# Patient Record
Sex: Male | Born: 1985 | Race: White | Hispanic: No | Marital: Married | Smoking: Current every day smoker
Health system: Southern US, Community
[De-identification: ages and names within clinical notes are randomized; demographics above are authoritative.]

## PROBLEM LIST (undated history)

## (undated) DIAGNOSIS — E66813 Obesity, class 3: Secondary | ICD-10-CM

## (undated) DIAGNOSIS — K219 Gastro-esophageal reflux disease without esophagitis: Secondary | ICD-10-CM

## (undated) DIAGNOSIS — E119 Type 2 diabetes mellitus without complications: Secondary | ICD-10-CM

## (undated) HISTORY — DX: Gastro-esophageal reflux disease without esophagitis: K21.9

## (undated) HISTORY — DX: Morbid (severe) obesity due to excess calories: E66.01

## (undated) HISTORY — DX: Obesity, class 3: E66.813

---
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
## 2006-05-18 ENCOUNTER — Ambulatory Visit (HOSPITAL_COMMUNITY): Admission: RE | Admit: 2006-05-18 | Discharge: 2006-05-18 | Payer: Self-pay | Admitting: Family Medicine

## 2011-08-31 ENCOUNTER — Other Ambulatory Visit: Payer: Self-pay | Admitting: Gastroenterology

## 2011-08-31 DIAGNOSIS — R197 Diarrhea, unspecified: Secondary | ICD-10-CM

## 2011-09-01 ENCOUNTER — Ambulatory Visit
Admission: RE | Admit: 2011-09-01 | Discharge: 2011-09-01 | Disposition: A | Discharge status: Home / Self Care | Payer: Federal, State, Local not specified - PPO | Source: Ambulatory Visit | Attending: Gastroenterology | Admitting: Gastroenterology

## 2011-09-01 DIAGNOSIS — R197 Diarrhea, unspecified: Secondary | ICD-10-CM

## 2013-12-29 ENCOUNTER — Ambulatory Visit (INDEPENDENT_AMBULATORY_CARE_PROVIDER_SITE_OTHER): Payer: Federal, State, Local not specified - PPO | Admitting: Family Medicine

## 2013-12-29 ENCOUNTER — Encounter: Payer: Self-pay | Admitting: Family Medicine

## 2013-12-29 ENCOUNTER — Ambulatory Visit (INDEPENDENT_AMBULATORY_CARE_PROVIDER_SITE_OTHER): Payer: Federal, State, Local not specified - PPO

## 2013-12-29 ENCOUNTER — Encounter (INDEPENDENT_AMBULATORY_CARE_PROVIDER_SITE_OTHER): Payer: Self-pay

## 2013-12-29 VITALS — BP 125/75 | HR 82 | Temp 98.8°F | Ht 73.0 in | Wt 325.2 lb

## 2013-12-29 DIAGNOSIS — M25569 Pain in unspecified knee: Secondary | ICD-10-CM | POA: Insufficient documentation

## 2013-12-29 DIAGNOSIS — M25562 Pain in left knee: Secondary | ICD-10-CM

## 2013-12-29 DIAGNOSIS — K219 Gastro-esophageal reflux disease without esophagitis: Secondary | ICD-10-CM | POA: Insufficient documentation

## 2013-12-29 DIAGNOSIS — R635 Abnormal weight gain: Secondary | ICD-10-CM

## 2013-12-29 MED ORDER — NAPROXEN 500 MG PO TABS: 500.0000 mg | ORAL_TABLET | Freq: Two times a day (BID) | ORAL | Status: DC

## 2013-12-29 NOTE — Progress Notes (Signed)
Patient ID: George Snow, male   DOB: 09/26/1986, 28 y.o.   MRN: [id number] SUBJECTIVE: CC: Chief Complaint  Patient presents with  . Acute Visit    c/o left knee pain onset yest.     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 [place]  [place] airport. No injury.no fever.  Started hurting yesterday after he came home.   Past Medical History  Diagnosis Date  . Obesity, Class III, BMI 40-49.9 (morbid obesity)   . GERD (gastroesophageal reflux disease)    No past surgical history on file. History   Social History  . Marital Status: Married    Spouse Name: N/A    Number of Children: N/A  . Years of Education: N/A   Occupational History  . Not on file.   Social History Main Topics  . Smoking status: Current Every Day Smoker  . Smokeless tobacco: Not on file  . Alcohol Use: Not on file  . Drug Use: Not on file  . Sexual Activity: Not on file   Other Topics Concern  . Not on file   Social History Narrative  . No narrative on file   Family History  Problem Relation Age of Onset  . Hypertension Mother   . Graves' disease Mother   . Rheum arthritis Father    No current outpatient prescriptions on file prior to visit.   No current facility-administered medications on file prior to visit.   No Known Allergies  There is no immunization history on file for this patient. Prior to Admission medications   Not on File     ROS: As above in the HPI. All other systems are stable or negative.  OBJECTIVE: APPEARANCE:  Patient in no acute distress.The patient appeared well nourished and normally developed. Acyanotic. Waist: VITAL SIGNS:BP 125/75  Pulse 82  Temp(Src) 98.8 F (37.1 C) (Oral)  Ht 6\' 1"  (1.854 m)  Wt 325 lb 3.2 oz (147.51 kg)  BMI 42.91 kg/m2  Morbidly obese WM SKIN: warm and  Dry without overt rashes, tattoos and scars  HEAD and Neck: without JVD, Head and scalp: normal Eyes:No scleral icterus. Fundi normal, eye movements normal. Ears:  Auricle normal, canal normal, Tympanic membranes normal, insufflation normal. Nose: normal Throat: normal Neck & thyroid: normal  CHEST & LUNGS: Chest wall: normal Lungs: Clear  CVS: Reveals the PMI to be normally located. Regular rhythm, First and Second Heart sounds are normal,  absence of murmurs, rubs or gallops. Peripheral vasculature: Radial pulses: normal Dorsal pedis pulses: normal Posterior pulses: normal  ABDOMEN:  Appearance: obese Benign, no organomegaly, no masses, no Abdominal Aortic enlargement. No Guarding , no rebound. No Bruits. Bowel sounds: normal  RECTAL: N/A GU: N/A  EXTREMETIES: nonedematous.  MUSCULOSKELETAL:  Spine: normal Joints: valgus  Deformity left knee soft tissue swelling. Crepitus /pop felt in the lateral margin with flexion. Flexion reduced by 15 degrees rest of ROM intact. Medial and lateral ligaments intact. . lachman intact. Grind  Test negative.  NEUROLOGIC: oriented to time,place and person; nonfocal. Strength is normal Sensory is normal Reflexes are normal Cranial Nerves are normal.  ASSESSMENT: Knee pain, acute, left - Plan: DG Knee 1-2 Views Left, naproxen (NAPROSYN) 500 MG tablet  Obesity, Class III, BMI 40-49.9 (morbid obesity)  GERD (gastroesophageal reflux disease)  PLAN:      Dr Woodroe ModeFrancis Kimla Furth's Recommendations  For nutrition information, I recommend books:  1).Eat to Live by Dr Monico HoarJoel Fuhrman. 2).Prevent and Reverse Heart Disease by Dr  Suzzette Righter. 3) Dr Katherina Right Book:  Program to Reverse Diabetes  Exercise recommendations are:  If unable to walk, then the patient can exercise in a chair 3 times a day. By flapping arms like a bird gently and raising legs outwards to the front.  If ambulatory, the patient can go for walks for 30 minutes 3 times a week. Then increase the intensity and duration as tolerated.  Goal is to try to attain exercise frequency to 5 times a week.  If applicable: Best to  perform resistance exercises (machines or weights) 2 days a week and cardio type exercises 3 days per week.  Encouraged aggressive weight loss.  Ice packs. Knee sleeve.  Orders Placed This Encounter  Procedures  . DG Knee 1-2 Views Left    Standing Status: Future     Number of Occurrences: 1     Standing Expiration Date: 02/28/2015    Order Specific Question:  Reason for Exam (SYMPTOM  OR DIAGNOSIS REQUIRED)    Answer:  pain    Order Specific Question:  Preferred imaging location?    Answer:  Internal  [facility] reading (PRIMARY) by  Dr.Gloriana Piltz: valgus  Deformity. No other acute findings.                                   Meds ordered this encounter  Medications  . naproxen (NAPROSYN) 500 MG tablet    Sig: Take 1 tablet (500 mg total) by mouth 2 (two) times daily with a meal.    Dispense:  30 tablet    Refill:  0   There are no discontinued medications. Return in about 1 week (around 01/05/2014) for Recheck medical problems. If not improving , consider MRI for r/o meniscus tear.  Gwenetta Devos P. Modesto Charon, M.D.

## 2013-12-29 NOTE — Patient Instructions (Addendum)
      Dr Woodroe ModeFrancis Salima Rumer's Recommendations  For nutrition information, I recommend books:  1).Eat to Live by Dr Monico HoarJoel Fuhrman. 2).Prevent and Reverse Heart Disease by Dr Suzzette Righteraldwell Esselstyn. 3) Dr Katherina RightNeal Barnard's Book:  Program to Reverse Diabetes  Exercise recommendations are:  If unable to walk, then the patient can exercise in a chair 3 times a day. By flapping arms like a bird gently and raising legs outwards to the front.  If ambulatory, the patient can go for walks for 30 minutes 3 times a week. Then increase the intensity and duration as tolerated.  Goal is to try to attain exercise frequency to 5 times a week.  If applicable: Best to perform resistance exercises (machines or weights) 2 days a week and cardio type exercises 3 days per week.    Ice packs to knee every 2 hours. Wear knee brace. Aggressive weight loss.

## 2014-12-07 IMAGING — CR DG KNEE 1-2V*L*
2 series · 2 of 2 positions shown · non-contrast
Comparison: 05/18/2006

CLINICAL DATA: Knee pain.

EXAM:
LEFT KNEE - 1-2 VIEW

[view not recorded (1 of 2)]
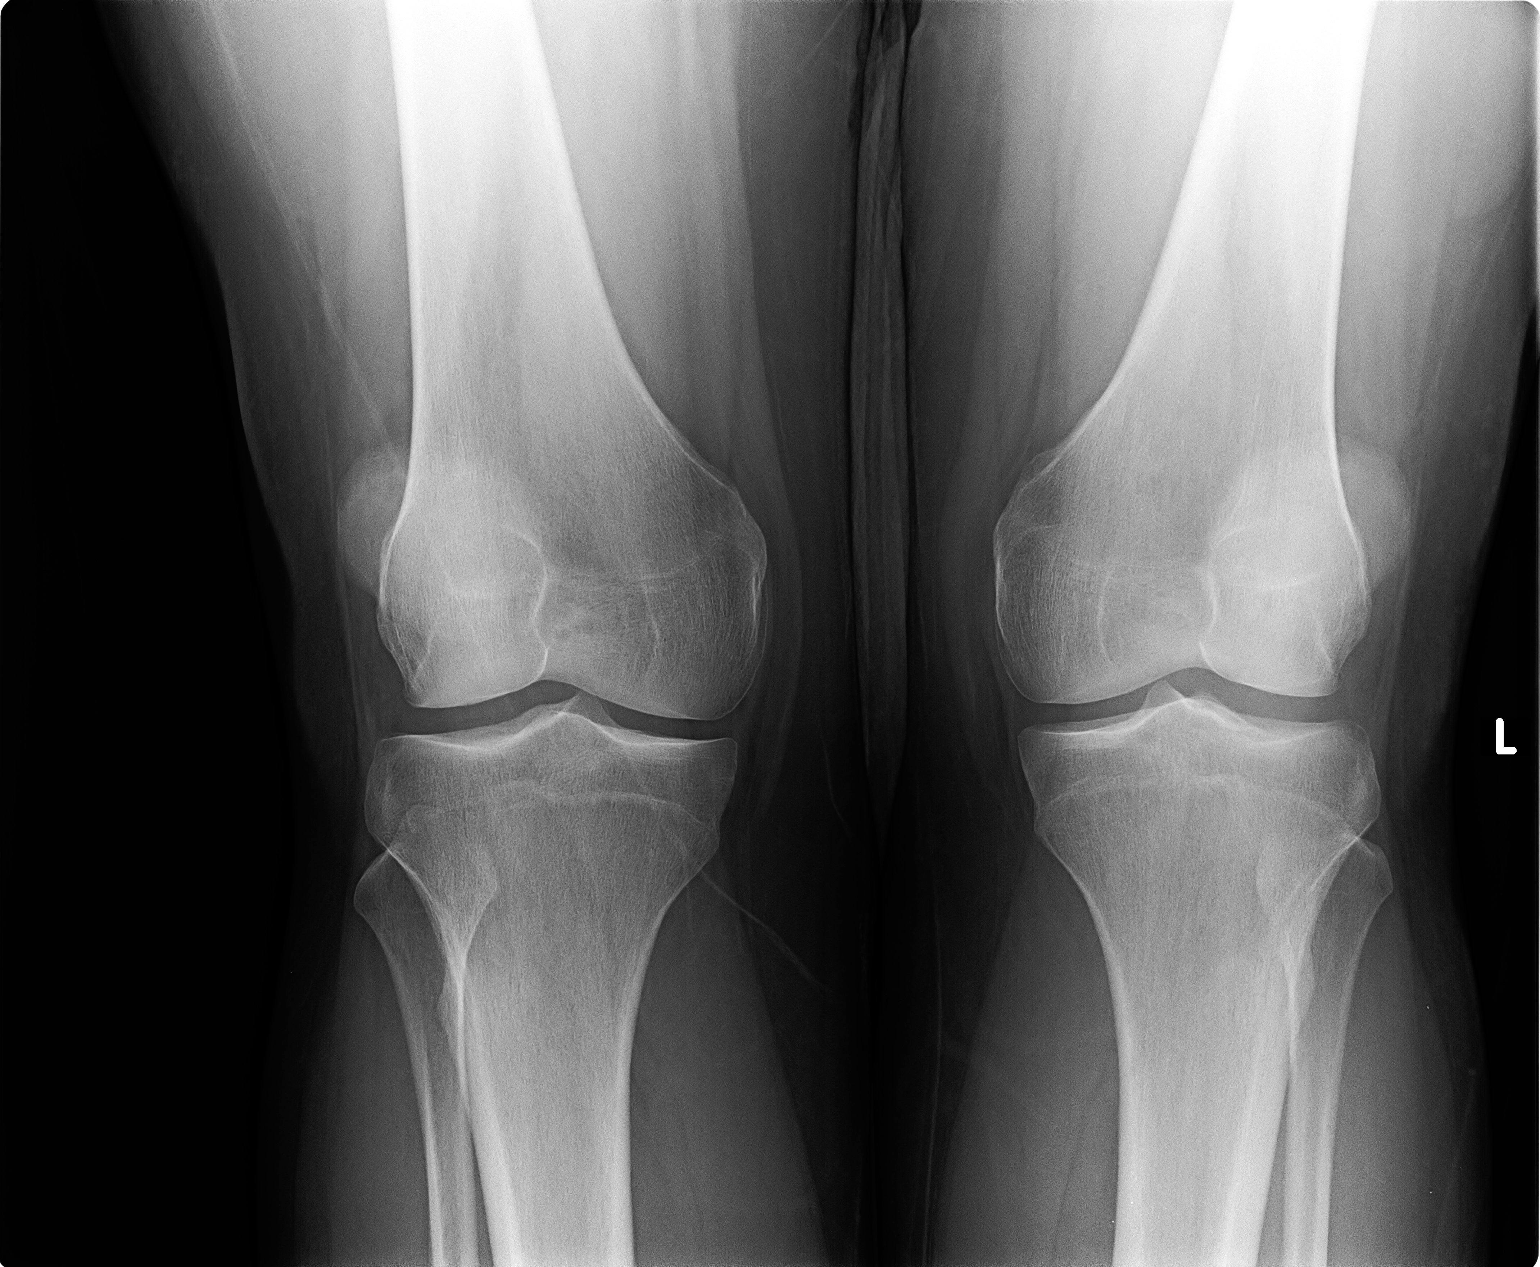

[view not recorded (2 of 2)]
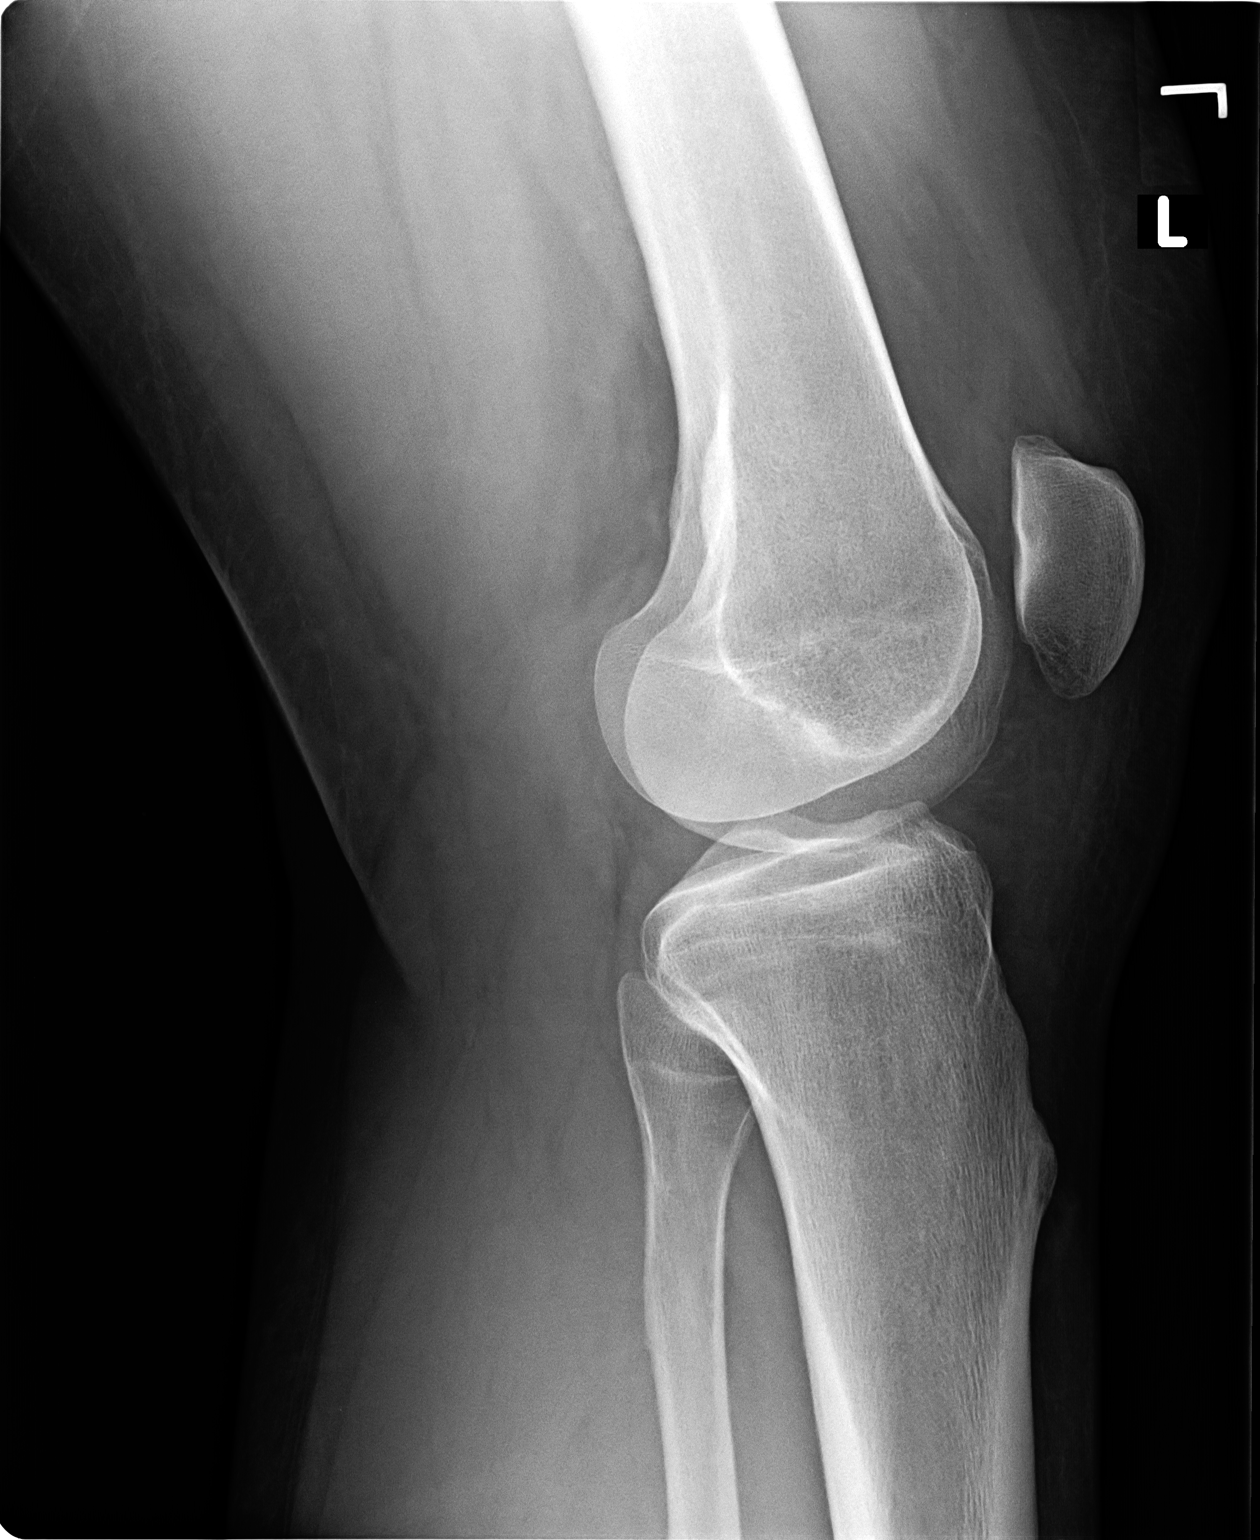

[2 of 2 positions shown; findings below may reference images not displayed]

FINDINGS: AP view of both knees were obtained. There is no significant joint
space narrowing. The left knee is located without acute fracture. No
definite joint effusion.
IMPRESSION: No acute bone abnormality in the left knee.

## 2016-09-26 ENCOUNTER — Ambulatory Visit (INDEPENDENT_AMBULATORY_CARE_PROVIDER_SITE_OTHER): Payer: Federal, State, Local not specified - PPO | Admitting: Psychology

## 2016-10-10 ENCOUNTER — Ambulatory Visit (INDEPENDENT_AMBULATORY_CARE_PROVIDER_SITE_OTHER): Payer: Federal, State, Local not specified - PPO | Admitting: Psychology

## 2016-10-30 ENCOUNTER — Encounter (HOSPITAL_COMMUNITY): Payer: Self-pay | Admitting: Psychology

## 2016-10-30 ENCOUNTER — Ambulatory Visit (INDEPENDENT_AMBULATORY_CARE_PROVIDER_SITE_OTHER): Payer: Federal, State, Local not specified - PPO | Admitting: Psychology

## 2016-10-30 DIAGNOSIS — F322 Major depressive disorder, single episode, severe without psychotic features: Secondary | ICD-10-CM | POA: Diagnosis not present

## 2016-10-30 NOTE — Progress Notes (Signed)
Patient reports that he has improved with regard to depressive symptoms with having significant depressive symptoms about 1 per week since last seen.  He is walking everyday and doing more with family.  Stress at work continues to be an issues; coping better with father's death.

## 2019-09-29 ENCOUNTER — Other Ambulatory Visit: Payer: Self-pay

## 2019-09-29 DIAGNOSIS — Z20822 Contact with and (suspected) exposure to covid-19: Secondary | ICD-10-CM

## 2019-09-30 LAB — NOVEL CORONAVIRUS, NAA: SARS-CoV-2, NAA: NOT DETECTED

## 2019-12-22 ENCOUNTER — Other Ambulatory Visit: Payer: Self-pay

## 2019-12-22 ENCOUNTER — Ambulatory Visit
Admission: EM | Admit: 2019-12-22 | Discharge: 2019-12-22 | Disposition: A | Discharge status: Home / Self Care | Payer: Federal, State, Local not specified - PPO | Attending: Emergency Medicine | Admitting: Emergency Medicine

## 2019-12-22 DIAGNOSIS — Z20822 Contact with and (suspected) exposure to covid-19: Secondary | ICD-10-CM | POA: Diagnosis not present

## 2019-12-22 NOTE — ED Provider Notes (Signed)
[place]   [id number] 12/22/19 Arrival Time: 1127   CC: COVID exposure  SUBJECTIVE: History from: patient.  MONTREZ MARIETTA is a 34 y.o. male who presents for COVID testing.  Wife tested positive for COVID yesterday.  Denies recent travel.  Denies aggravating or alleviating symptoms.  Denies previous COVID infection.   Denies fever, chills, fatigue, nasal congestion, rhinorrhea, sore throat, cough, SOB, wheezing, chest pain, nausea, vomiting, changes in bowel or bladder habits.      ROS: As per HPI.  All other pertinent ROS negative.     Past Medical History:  Diagnosis Date  . GERD (gastroesophageal reflux disease)   . Obesity, Class III, BMI 40-49.9 (morbid obesity) ([facility])    History reviewed. No pertinent surgical history. No Known Allergies No current facility-administered medications on file prior to encounter.   No current outpatient medications on file prior to encounter.   Social History   Socioeconomic History  . Marital status: Married    Spouse name: Not on file  . Number of children: Not on file  . Years of education: Not on file  . Highest education level: Not on file  Occupational History  . Not on file  Tobacco Use  . Smoking status: Current Every Day Smoker  Substance and Sexual Activity  . Alcohol use: Not on file  . Drug use: Not on file  . Sexual activity: Not on file  Other Topics Concern  . Not on file  Social History Narrative  . Not on file   Social Determinants of Health   Financial Resource Strain:   . Difficulty of Paying Living Expenses: Not on file  Food Insecurity:   . Worried About Charity fundraiser in the Last Year: Not on file  . Ran Out of Food in the Last Year: Not on file  Transportation Needs:   . Lack of Transportation (Medical): Not on file  . Lack of Transportation (Non-Medical): Not on file  Physical Activity:   . Days of Exercise per Week: Not on file  . Minutes of Exercise per Session: Not on file    Stress:   . Feeling of Stress : Not on file  Social Connections:   . Frequency of Communication with Friends and Family: Not on file  . Frequency of Social Gatherings with Friends and Family: Not on file  . Attends Religious Services: Not on file  . Active Member of Clubs or Organizations: Not on file  . Attends Archivist Meetings: Not on file  . Marital Status: Not on file  Intimate Partner Violence:   . Fear of Current or Ex-Partner: Not on file  . Emotionally Abused: Not on file  . Physically Abused: Not on file  . Sexually Abused: Not on file   Family History  Problem Relation Age of Onset  . Hypertension Mother   . Graves' disease Mother   . Rheum arthritis Father     OBJECTIVE:  Vitals:   12/22/19 1142  BP: (!) 141/97  Pulse: 96  Resp: 17  Temp: 98.2 F (36.8 C)  TempSrc: Oral  SpO2: 96%     General appearance: alert; well-appearing, nontoxic; speaking in full sentences and tolerating own secretions HEENT: NCAT; Ears: EACs clear, TMs pearly gray; Eyes: PERRL.  EOM grossly intact. Nose: nares patent without rhinorrhea, Throat: oropharynx clear, tonsils non erythematous or enlarged, uvula midline  Neck: supple without LAD Lungs: unlabored respirations, symmetrical air entry; cough: absent; no respiratory distress; CTAB Heart:  regular rate and rhythm.  Skin: warm and dry Psychological: alert and cooperative; normal mood and affect  ASSESSMENT & PLAN:  1. Close exposure to COVID-19 virus   2. Suspected COVID-19 virus infection     No orders of the defined types were placed in this encounter.  COVID testing ordered.  It will take between 5-7 days for test results.  Someone will contact you regarding abnormal results.    In the meantime: You should remain isolated in your home for 10 days from symptom onset AND greater than 72 hours after symptoms resolution (absence of fever without the use of fever-reducing medication and improvement in respiratory  symptoms), whichever is longer OR 14 days from exposure Get plenty of rest and push fluids Use OTC zyrtec for nasal congestion, runny nose, and/or sore throat Use OTC flonase for nasal congestion and runny nose Use medications daily for symptom relief Use OTC medications like ibuprofen or tylenol as needed fever or pain Call or go to the ED if you have any new or worsening symptoms such as fever, cough, shortness of breath, chest tightness, chest pain, turning blue, changes in mental status, etc...   Reviewed expectations re: course of current medical issues. Questions answered. Outlined signs and symptoms indicating need for more acute intervention. Patient verbalized understanding. After Visit Summary given.         Rennis Harding, PA-C 12/22/19 1159

## 2019-12-22 NOTE — ED Triage Notes (Signed)
Pt presents to UC stating his wife tested positive for covid yesterday. Pt is asymptomatic and would like a covid test

## 2019-12-22 NOTE — Discharge Instructions (Signed)

## 2019-12-23 LAB — NOVEL CORONAVIRUS, NAA: SARS-CoV-2, NAA: NOT DETECTED

## 2020-10-22 ENCOUNTER — Ambulatory Visit
Admission: RE | Admit: 2020-10-22 | Discharge: 2020-10-22 | Disposition: A | Discharge status: Home / Self Care | Payer: Federal, State, Local not specified - PPO | Source: Ambulatory Visit | Attending: Emergency Medicine | Admitting: Emergency Medicine

## 2020-10-22 ENCOUNTER — Other Ambulatory Visit: Payer: Self-pay

## 2020-10-22 VITALS — BP 129/74 | HR 114 | Temp 100.0°F | Resp 22

## 2020-10-22 DIAGNOSIS — Z1152 Encounter for screening for COVID-19: Secondary | ICD-10-CM

## 2020-10-22 DIAGNOSIS — J069 Acute upper respiratory infection, unspecified: Secondary | ICD-10-CM | POA: Diagnosis not present

## 2020-10-22 MED ORDER — DEXAMETHASONE 4 MG PO TABS: 4.0000 mg | ORAL_TABLET | Freq: Every day | ORAL | 0 refills | Status: AC

## 2020-10-22 MED ORDER — BENZONATATE 100 MG PO CAPS: 100.0000 mg | ORAL_CAPSULE | Freq: Three times a day (TID) | ORAL | 0 refills | Status: DC

## 2020-10-22 MED ORDER — FLUTICASONE PROPIONATE 50 MCG/ACT NA SUSP: 1.0000 | Freq: Every day | NASAL | 0 refills | Status: DC

## 2020-10-22 MED ORDER — CETIRIZINE HCL 10 MG PO TABS: 10.0000 mg | ORAL_TABLET | Freq: Every day | ORAL | 0 refills | Status: DC

## 2020-10-22 NOTE — ED Provider Notes (Addendum)
[facility]   [id number] 10/22/20 Arrival Time: 1553   CC: COVID symptoms  SUBJECTIVE: History from: family.  George Snow is a 34 y.o. male who p presents to the urgent care with a complaint of cough, nasal congestion for the past 2 days.  Denies sick exposure to COVID, flu or strep.  Denies recent travel.  Has tried OTC medication without relief.  Denies aggravating factors.  Denies denies previous symptoms in the past.   Denies fever, chills, fatigue, sinus pain, rhinorrhea, sore throat, SOB, wheezing, chest pain, nausea, changes in bowel or bladder habits.     ROS: As per HPI.  All other pertinent ROS negative.      Past Medical History:  Diagnosis Date  . GERD (gastroesophageal reflux disease)   . Obesity, Class III, BMI 40-49.9 (morbid obesity) ([facility])    History reviewed. No pertinent surgical history. No Known Allergies No current facility-administered medications on file prior to encounter.   No current outpatient medications on file prior to encounter.   Social History   Socioeconomic History  . Marital status: Married    Spouse name: Not on file  . Number of children: Not on file  . Years of education: Not on file  . Highest education level: Not on file  Occupational History  . Not on file  Tobacco Use  . Smoking status: Current Every Day Smoker  Substance and Sexual Activity  . Alcohol use: Not on file  . Drug use: Not on file  . Sexual activity: Not on file  Other Topics Concern  . Not on file  Social History Narrative  . Not on file   Social Determinants of Health   Financial Resource Strain:   . Difficulty of Paying Living Expenses: Not on file  Food Insecurity:   . Worried About Programme researcher, broadcasting/film/video in the Last Year: Not on file  . Ran Out of Food in the Last Year: Not on file  Transportation Needs:   . Lack of Transportation (Medical): Not on file  . Lack of Transportation (Non-Medical): Not on file  Physical Activity:   . Days of  Exercise per Week: Not on file  . Minutes of Exercise per Session: Not on file  Stress:   . Feeling of Stress : Not on file  Social Connections:   . Frequency of Communication with Friends and Family: Not on file  . Frequency of Social Gatherings with Friends and Family: Not on file  . Attends Religious Services: Not on file  . Active Member of Clubs or Organizations: Not on file  . Attends Banker Meetings: Not on file  . Marital Status: Not on file  Intimate Partner Violence:   . Fear of Current or Ex-Partner: Not on file  . Emotionally Abused: Not on file  . Physically Abused: Not on file  . Sexually Abused: Not on file   Family History  Problem Relation Age of Onset  . Hypertension Mother   . Graves' disease Mother   . Rheum arthritis Father     OBJECTIVE:  Vitals:   10/22/20 1618  BP: 129/74  Pulse: (!) 114  Resp: (!) 22  Temp: 100 F (37.8 C)  SpO2: 92%     General appearance: alert; appears fatigued, but nontoxic; speaking in full sentences and tolerating own secretions HEENT: NCAT; Ears: EACs clear, TMs pearly gray; Eyes: PERRL.  EOM grossly intact. Sinuses: nontender; Nose: nares patent without rhinorrhea, Throat: oropharynx clear, tonsils non  erythematous or enlarged, uvula midline  Neck: supple without LAD Lungs: unlabored respirations, symmetrical air entry; cough: moderate, marked; no respiratory distress; CTAB Heart: regular rate and rhythm.  Radial pulses 2+ symmetrical bilaterally Skin: warm and dry Psychological: alert and cooperative; normal mood and affect  LABS:  No results found for this or any previous visit (from the past 24 hour(s)).   ASSESSMENT & PLAN:  1. Encounter for screening for COVID-19   2. URI with cough and congestion     Meds ordered this encounter  Medications  . fluticasone (FLONASE) 50 MCG/ACT nasal spray    Sig: Place 1 spray into both nostrils daily for 14 days.    Dispense:  16 g    Refill:  0  .  cetirizine (ZYRTEC ALLERGY) 10 MG tablet    Sig: Take 1 tablet (10 mg total) by mouth daily.    Dispense:  30 tablet    Refill:  0  . dexamethasone (DECADRON) 4 MG tablet    Sig: Take 1 tablet (4 mg total) by mouth daily for 7 days.    Dispense:  7 tablet    Refill:  0  . benzonatate (TESSALON) 100 MG capsule    Sig: Take 1 capsule (100 mg total) by mouth every 8 (eight) hours.    Dispense:  30 capsule    Refill:  0    Discharge instructions  COVID-19, RSV, flu A/B testing ordered.  It will take between 2-7 days for test results.  Someone will contact you regarding abnormal results.    In the meantime: You should remain isolated in your home for 10 days from symptom onset AND greater than 24 hours after symptoms resolution (absence of fever without the use of fever-reducing medication and improvement in respiratory symptoms), whichever is longer Get plenty of rest and push fluids Tessalon Perles prescribed for cough Zyrtec for nasal congestion, runny nose, and/or sore throat Flonase for nasal congestion and runny nose Decadron was prescribed Use medications daily for symptom relief Use OTC medications like ibuprofen or tylenol as needed fever or pain Call or go to the ED if you have any new or worsening symptoms such as fever, worsening cough, shortness of breath, chest tightness, chest pain, turning blue, changes in mental status, etc...   Reviewed expectations re: course of current medical issues. Questions answered. Outlined signs and symptoms indicating need for more acute intervention. Patient verbalized understanding. After Visit Summary given.         Durward Parcel, FNP 10/22/20 1659    Durward Parcel, FNP 10/22/20 1702

## 2020-10-22 NOTE — Discharge Instructions (Addendum)
COVID-19, RSV, flu A/B testing ordered.  It will take between 2-7 days for test results.  Someone will contact you regarding abnormal results.    In the meantime: You should remain isolated in your home for 10 days from symptom onset AND greater than 24  hours after symptoms resolution (absence of fever without the use of fever-reducing medication and improvement in respiratory symptoms), whichever is longer Get plenty of rest and push fluids Tessalon Perles prescribed for cough Zyrtec for nasal congestion, runny nose, and/or sore throat Flonase for nasal congestion and runny nose Decadron was prescribed Use medications daily for symptom relief Use OTC medications like ibuprofen or tylenol as needed fever or pain Call or go to the ED if you have any new or worsening symptoms such as fever, worsening cough, shortness of breath, chest tightness, chest pain, turning blue, changes in mental status, etc.. 

## 2020-10-22 NOTE — ED Triage Notes (Signed)
Pt presents with c/o nasal congestion and cough that began on wenesday

## 2020-10-23 LAB — COVID-19, FLU A+B AND RSV
Influenza A, NAA: NOT DETECTED
Influenza B, NAA: NOT DETECTED
RSV, NAA: NOT DETECTED
SARS-CoV-2, NAA: NOT DETECTED

## 2021-01-01 ENCOUNTER — Ambulatory Visit
Admission: RE | Admit: 2021-01-01 | Discharge: 2021-01-01 | Disposition: A | Discharge status: Home / Self Care | Payer: Federal, State, Local not specified - PPO | Source: Ambulatory Visit | Attending: Emergency Medicine | Admitting: Emergency Medicine

## 2021-01-01 ENCOUNTER — Other Ambulatory Visit: Payer: Self-pay

## 2021-01-01 VITALS — BP 127/83 | HR 85 | Temp 98.1°F | Resp 18

## 2021-01-01 DIAGNOSIS — J069 Acute upper respiratory infection, unspecified: Secondary | ICD-10-CM

## 2021-01-01 DIAGNOSIS — Z1152 Encounter for screening for COVID-19: Secondary | ICD-10-CM | POA: Diagnosis not present

## 2021-01-01 MED ORDER — DEXAMETHASONE 4 MG PO TABS: 4.0000 mg | ORAL_TABLET | Freq: Every day | ORAL | 0 refills | Status: AC

## 2021-01-01 MED ORDER — BENZONATATE 100 MG PO CAPS: 100.0000 mg | ORAL_CAPSULE | Freq: Three times a day (TID) | ORAL | 0 refills | Status: DC | PRN

## 2021-01-01 MED ORDER — CETIRIZINE HCL 10 MG PO TABS: 10.0000 mg | ORAL_TABLET | Freq: Every day | ORAL | 0 refills | Status: DC

## 2021-01-01 MED ORDER — FLUTICASONE PROPIONATE 50 MCG/ACT NA SUSP: 1.0000 | Freq: Every day | NASAL | 0 refills | Status: AC

## 2021-01-01 NOTE — ED Provider Notes (Signed)
[facility]   [id number] 01/01/21 Arrival Time: 1154   CC: COVID symptoms  SUBJECTIVE: History from: patient.  George Snow is a 35 y.o. male presented to the urgent care with a complaint of cough, nasal congestion, and body aches for the past couple days.  Denies sick exposure to COVID, flu or strep.  Denies recent travel.  Has tried OTC medication without relief.  Denies alleviating or aggravating factors.  Denies previous symptoms in the past.   Denies fever, chills, fatigue, sinus pain, rhinorrhea, sore throat, SOB, wheezing, chest pain, nausea, changes in bowel or bladder habits.     ROS: As per HPI.  All other pertinent ROS negative.      Past Medical History:  Diagnosis Date  . GERD (gastroesophageal reflux disease)   . Obesity, Class III, BMI 40-49.9 (morbid obesity) ([facility])    No past surgical history on file. No Known Allergies No current facility-administered medications on file prior to encounter.   No current outpatient medications on file prior to encounter.   Social History   Socioeconomic History  . Marital status: Married    Spouse name: Not on file  . Number of children: Not on file  . Years of education: Not on file  . Highest education level: Not on file  Occupational History  . Not on file  Tobacco Use  . Smoking status: Current Every Day Smoker  . Smokeless tobacco: Never Used  Substance and Sexual Activity  . Alcohol use: Not Currently  . Drug use: Never  . Sexual activity: Not on file  Other Topics Concern  . Not on file  Social History Narrative  . Not on file   Social Determinants of Health   Financial Resource Strain: Not on file  Food Insecurity: Not on file  Transportation Needs: Not on file  Physical Activity: Not on file  Stress: Not on file  Social Connections: Not on file  Intimate Partner Violence: Not on file   Family History  Problem Relation Age of Onset  . Hypertension Mother   . Graves' disease Mother    . Rheum arthritis Father     OBJECTIVE:  Vitals:   01/01/21 1244  BP: 127/83  Pulse: 85  Resp: 18  Temp: 98.1 F (36.7 C)  SpO2: 96%     General appearance: alert; appears fatigued, but nontoxic; speaking in full sentences and tolerating own secretions HEENT: NCAT; Ears: EACs clear, TMs pearly gray; Eyes: PERRL.  EOM grossly intact. Sinuses: nontender; Nose: nares patent without rhinorrhea, Throat: oropharynx clear, tonsils non erythematous or enlarged, uvula midline  Neck: supple without LAD Lungs: unlabored respirations, symmetrical air entry; cough: moderate; no respiratory distress; CTAB Heart: regular rate and rhythm.  Radial pulses 2+ symmetrical bilaterally Skin: warm and dry Psychological: alert and cooperative; normal mood and affect  LABS:  No results found for this or any previous visit (from the past 24 hour(s)).   ASSESSMENT & PLAN:  1. Encounter for screening for COVID-19   2. URI with cough and congestion     Meds ordered this encounter  Medications  . benzonatate (TESSALON) 100 MG capsule    Sig: Take 1 capsule (100 mg total) by mouth 3 (three) times daily as needed for cough.    Dispense:  30 capsule    Refill:  0  . cetirizine (ZYRTEC ALLERGY) 10 MG tablet    Sig: Take 1 tablet (10 mg total) by mouth daily.    Dispense:  30 tablet  Refill:  0  . dexamethasone (DECADRON) 4 MG tablet    Sig: Take 1 tablet (4 mg total) by mouth daily for 7 days.    Dispense:  7 tablet    Refill:  0  . fluticasone (FLONASE) 50 MCG/ACT nasal spray    Sig: Place 1 spray into both nostrils daily for 14 days.    Dispense:  16 g    Refill:  0    Discharge Instructions    COVID-19, flu A/B testing ordered.  It will take between 2-7 days for test results.  Someone will contact you regarding abnormal results.    Get plenty of rest and push fluids Tessalon Perles prescribed for cough Zyrtec for nasal congestion, runny nose, and/or sore throat Flonase for nasal  congestion Decadron was prescribed Use medications daily for symptom relief Use OTC medications like ibuprofen or tylenol as needed fever or pain Call or go to the ED if you have any new or worsening symptoms such as fever, worsening cough, shortness of breath, chest tightness, chest pain, turning blue, changes in mental status, etc...   Reviewed expectations re: course of current medical issues. Questions answered. Outlined signs and symptoms indicating need for more acute intervention. Patient verbalized understanding. After Visit Summary given.         Durward Parcel, FNP 01/01/21 1300

## 2021-01-01 NOTE — Discharge Instructions (Signed)
COVID-19, flu A/B testing ordered.  It will take between 2-7 days for test results.  Someone will contact you regarding abnormal results.    Get plenty of rest and push fluids Tessalon Perles prescribed for cough Zyrtec for nasal congestion, runny nose, and/or sore throat Flonase for nasal congestion Decadron was prescribed Use medications daily for symptom relief Use OTC medications like ibuprofen or tylenol as needed fever or pain Call or go to the ED if you have any new or worsening symptoms such as fever, worsening cough, shortness of breath, chest tightness, chest pain, turning blue, changes in mental status, etc..Marland Kitchen

## 2021-01-01 NOTE — ED Triage Notes (Signed)
Pt presents with c/o nasal congestion and body aches for past couple of days

## 2021-01-05 LAB — COVID-19, FLU A+B NAA
Influenza A, NAA: NOT DETECTED
Influenza B, NAA: NOT DETECTED
SARS-CoV-2, NAA: DETECTED — AB

## 2021-11-24 ENCOUNTER — Encounter: Payer: Federal, State, Local not specified - PPO | Attending: Family | Admitting: Nutrition

## 2021-11-24 VITALS — Ht 74.0 in | Wt 334.0 lb

## 2021-11-24 DIAGNOSIS — E66813 Obesity, class 3: Secondary | ICD-10-CM

## 2021-11-24 DIAGNOSIS — E118 Type 2 diabetes mellitus with unspecified complications: Secondary | ICD-10-CM

## 2021-11-24 DIAGNOSIS — I10 Essential (primary) hypertension: Secondary | ICD-10-CM | POA: Diagnosis present

## 2021-11-24 DIAGNOSIS — E782 Mixed hyperlipidemia: Secondary | ICD-10-CM | POA: Diagnosis present

## 2021-11-24 NOTE — Patient Instructions (Signed)
Goals  Follow Plant based meal plan Eat 45 g CHO at meals. Keep drinking water only-gallon Measure foods out Exercise 150 minutes a week. Test blood sugars and twice a day. Get A1C down to 7%.

## 2021-11-24 NOTE — Progress Notes (Signed)
Medical Nutrition Therapy  Appointment Start time:  1300  Appointment End time:  1400  Primary concerns today: Dm Type 2  Referral diagnosis: E11. 8 Preferred learning style: no preference Learning readiness: Change in progress    NUTRITION ASSESSMENT  Just started Ozempic once a week for the last 2 weeks. HIs wife is nurse.. Dx weight was 365 lbs. Currently 334 lbs. Emotional eater. Eats later at night. Doesn't eat a lot of fresh fruits and vegetables. Not working out. Just diagnosed with DM and wants to get his weight off and reverse his diabetes. Gained weight when he changed jobs and this one is more sitting and working on [employer]. Needs meter and testing supplies.  Anthropometrics  Wt Readings from Last 3 Encounters:  11/24/21 (!) 334 lb (151.5 kg)  12/29/13 (!) 325 lb 3.2 oz (147.5 kg)   Ht Readings from Last 3 Encounters:  11/24/21 6\' 2"  (1.88 m)  12/29/13 6\' 1"  (1.854 m)   Body mass index is 42.88 kg/m. @BMIFA @ Facility age limit for growth percentiles is 20 years. Facility age limit for growth percentiles is 20 years.    Clinical Medical Hx: Morbid obesity Medications: Trulicity weekly Labs: A1C 8.2% 09/22/21. Metabolic Panel Specimen:  Blood  Ref Range & Units 2 mo ago Comments  Sodium 135 - 146 MMOL/L 137    Potassium 3.5 - 5.3 MMOL/L 4.3    Chloride 98 - 110 MMOL/L 99    CO2 23 - 30 MMOL/L 29    BUN 8 - 24 MG/DL 9    Glucose 70 - 99 MG/DL 83  Patients taking eltrombopag at doses >/= 100 mg daily may show falsely elevated values of 10% or greater.  Creatinine 0.50 - 1.50 MG/DL    Calcium 8.5 - MG/DL 9.9    Total Protein 6.0 - 8.3 G/DL 8.2  Patients taking eltrombopag at doses >/= 100 mg daily may show falsely elevated values of 10% or greater.  Albumin  3.5 - 5.0 G/DL 4.8    Total Bilirubin 0.1 - 1.2 MG/DL 0.9  Patients taking eltrombopag at doses >/= 100 mg daily may show falsely elevated values of 10% or greater.  Alkaline Phosphatase  25 - 125 IU/L or U/L 84    AST (SGOT) 5 - 40 IU/L or U/L 67 High     ALT (SGPT) 5 - 50 IU/L or U/L 98 High     Anion Gap 4 - 14 MMOL/L 8    Est. GFR >=60 ML/MIN/1.73 M*2  ML/MIN/1.73 M*2 >90      Specimen:  Blood  Ref Range & Units 2 mo ago Comments  LDL Direct <130 mg/dL 11/22/21 High     Total Cholesterol 25 - 199 MG/DL 1.54 High     Triglycerides 10 - 150 MG/DL 00.8 High     HDL Cholesterol 35 - 135 MG/DL 28 Low     Total Chol / HDL Cholesterol <4.5 8.3 High     Non-HDL Cholesterol MG/DL 676   TARGET: <(LDL-C TARGET + 30)MG/DL  Coronary Heart Disease Risk Table   TOTAL CHOLESTEROL/HDL RATIO:CHD RISK  CORONARY HEART DISEASE RISK TABLE                       MEN     WOMEN  1/2 AVERAGE RISK    3.4      3.3  AVERAGE RISK        5.8      4.4  2  X AVERAGE RISK    9.6      7.1  3 X AVERAGE RISK    23.4     11.0   Use the calculated Patient Ratio and the CHD Risk Table to determine the patient's CHD Risk.   ATP III Classification (LDL):  <100       mg/dl     Optimal  101-751    mg/dl     Near or Above Optimal  130-159    mg/dl     Borderline  025-852    mg/dl     High  >778       mg/dl     Very High  Resulting Agency  [facility]   Specimen Collected: 09/22/21 08:10 Last Resulted: 09/22/21 13:58  Received From: [facility]  Result Received: 11/24/21 13:01    Notable Signs/Symptoms: Fatigue, lack of energy, increased thirst, frequent urination  Lifestyle & Dietary Hx He lives with his wife. He does the cooking and shopping in the home. 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   24-Hr Dietary Recall First Meal: 1/2 c cottage cheese, fruit cup 1/2 Snack: bread crisp crackers with NS peanut butter water Second Meal: Pork  BBQ and coleslaw, SF sauce water Snack:  Third Meal: Powerbowl from [employer]:  Beverages: water  Estimated Energy Needs Calories: 1800 Carbohydrate: 200g Protein: 135g Fat: 50g   NUTRITION DIAGNOSIS  NB-1.1 Food and nutrition-related knowledge deficit As related to Diabetes Type 2.  As evidenced by A1C 8.4%.   NUTRITION INTERVENTION  Nutrition education (E-1) on the following topics:  Nutrition and Diabetes education provided on My Plate, CHO counting, meal planning, portion sizes, timing of meals, avoiding snacks between meals unless having a low blood sugar, target ranges for A1C and blood sugars, signs/symptoms and treatment of hyper/hypoglycemia, monitoring blood sugars, taking medications as prescribed, benefits of exercising 30 minutes per day and prevention of complications of DM. Lifestyle Medicine - Whole Food, Plant Predominant Nutrition is highly recommended: Eat Plenty of vegetables, Mushrooms, fruits, Legumes, Whole Grains, Nuts, seeds in lieu of processed meats, processed snacks/pastries red meat, poultry, eggs.    -It is better to avoid simple carbohydrates including: Cakes, Sweet Desserts, Ice Cream, Soda (diet and regular), Sweet Tea, Candies, Chips, Cookies, Store Bought Juices, Alcohol in Excess of  1-2 drinks a day, Lemonade,  Artificial Sweeteners, Doughnuts, Coffee Creamers, "Sugar-free" Products, etc, etc.  This is not a complete list.....  Exercise: If you are able: 30 -60 minutes a day ,4 days a week, or 150 minutes a week.  The longer the better.  Combine stretch, strength, and aerobic activities.  If you were told in the past that you have high risk for cardiovascular diseases, you may seek evaluation by your heart doctor prior to initiating moderate to intense exercise programs.  Handouts Provided Include  Plant based lifestyle nutrition My Plate Meal Plan Card   Learning Style & Readiness for Change Teaching method utilized: Visual & Auditory   Demonstrated degree of understanding via: Teach Back  Barriers to learning/adherence to lifestyle change: none  Goals Established by Pt DietaGoals  Follow Plant based meal plan Eat 45 g CHO at meals. Keep drinking water only-gallon Measure foods out  Exercise 150 minutes a week. Test blood sugars and twice a day. Get A1C down to 7%.ry intake, weekly physical activity, and blood sugars in 1 mont   MONITORING & EVALUATION Monitor blood sugars and weight.  Next Steps  Patient is to work on meal planning and exercising.Marland Kitchen

## 2021-12-14 ENCOUNTER — Encounter: Payer: Self-pay | Admitting: Nutrition

## 2022-01-16 ENCOUNTER — Encounter: Payer: Federal, State, Local not specified - PPO | Attending: Physician Assistant | Admitting: Nutrition

## 2022-01-16 ENCOUNTER — Encounter: Payer: Self-pay | Admitting: Nutrition

## 2022-01-16 ENCOUNTER — Other Ambulatory Visit: Payer: Self-pay

## 2022-01-16 VITALS — Ht 74.0 in | Wt 325.6 lb

## 2022-01-16 DIAGNOSIS — I10 Essential (primary) hypertension: Secondary | ICD-10-CM | POA: Diagnosis present

## 2022-01-16 DIAGNOSIS — E118 Type 2 diabetes mellitus with unspecified complications: Secondary | ICD-10-CM | POA: Insufficient documentation

## 2022-01-16 DIAGNOSIS — E782 Mixed hyperlipidemia: Secondary | ICD-10-CM | POA: Diagnosis present

## 2022-01-16 NOTE — Patient Instructions (Addendum)
Goals  Maintain current food choices Keep eating 3 meals per day Focus on plant based foods Increase water to gallon. Keep up the job. Watch [employer]

## 2022-01-16 NOTE — Progress Notes (Signed)
Medical Nutrition Therapy Followup Appointment Start time:  1630 Appointment End time:  1645  Primary concerns today: Dm Type 2  Referral diagnosis: E11. 8 Preferred learning style: no preference Learning readiness: Change in progress    NUTRITION ASSESSMENT  Eating more consistently. Keeping water intake. Lost 9 lbs since last visit.  Not having cravings anymore. Says the Wellbutrin is helping curb cravings. His GI track feels a lot better.  No more swelling in his ankles. No longer having arthritis pain like he was. Sleeps better. Better mood. Motivated and is cooking different foods. Watched [employer] and was impressed. HE got the cookbook and is starting to cook more plant based meals. Is wife is supportive but doesn't want to go plant based herself. A1C down to 6.2% from 8.2% since last A1C check. Feels much better as he is eating more plant based foods.  Working on lowering his cholesterol with more fiber rich plant based foods. Starting to walk and do some exercises.  FBS: 85-115 mg/dl Bedtime: 43-329'J.   Goals previous set that he has accoplished in only 1 month Follow Plant based meal plan-done Eat 45 g CHO at meals. dibe Keep drinking water only-gallon-done Measure foods out-done Exercise 150 minutes a week.done Test blood sugars and twice a day. Done. Get A1C down to 7%.ry intake, weekly physical activity, and blood sugars in 1 month. Done A1C down to 5.2% already.  Anthropometrics  Wt Readings from Last 3 Encounters:  01/16/22 (!) 325 lb 9.6 oz (147.7 kg)  11/24/21 (!) 334 lb (151.5 kg)  12/29/13 (!) 325 lb 3.2 oz (147.5 kg)   Ht Readings from Last 3 Encounters:  01/16/22 6\' 2"  (1.88 m)  11/24/21 6\' 2"  (1.88 m)  12/29/13 6\' 1"  (1.854 m)   Body mass index is 41.8 kg/m. @BMIFA @ Facility age limit for growth percentiles is 20 years. Facility age limit for growth percentiles is 20 years.    Clinical Medical Hx: Morbid obesity Medications: Trulicity  weekly Labs: A1C 8.2% 09/22/21. Metabolic Panel Specimen:  Blood  Ref Range & Units 2 mo ago Comments  Sodium 135 - 146 MMOL/L 137    Potassium 3.5 - 5.3 MMOL/L 4.3    Chloride 98 - 110 MMOL/L 99    CO2 23 - 30 MMOL/L 29    BUN 8 - 24 MG/DL 9    Glucose 70 - 99 MG/DL 83  Patients taking eltrombopag at doses >/= 100 mg daily may show falsely elevated values of 10% or greater.  Creatinine 0.50 - 1.50 MG/DL 02/26/14    Calcium 8.5 - MG/DL 9.9    Total Protein 6.0 - 8.3 G/DL 8.2  Patients taking eltrombopag at doses >/= 100 mg daily may show falsely elevated values of 10% or greater.  Albumin  3.5 - 5.0 G/DL 4.8    Total Bilirubin 0.1 - 1.2 MG/DL 0.9  Patients taking eltrombopag at doses >/= 100 mg daily may show falsely elevated values of 10% or greater.  Alkaline Phosphatase 25 - 125 IU/L or U/L 84    AST (SGOT) 5 - 40 IU/L or U/L 67 High     ALT (SGPT) 5 - 50 IU/L or U/L 98 High     Anion Gap 4 - 14 MMOL/L 8    Est. GFR >=60 ML/MIN/1.73 M*2  ML/MIN/1.73 M*2 >90      Specimen:  Blood  Ref Range & Units 2 mo ago Comments  LDL Direct <130 mg/dL High  Total Cholesterol 25 - 199 MG/DL 161231 High     Triglycerides 10 - 150 MG/DL 096227 High     HDL Cholesterol 35 - 135 MG/DL 28 Low     Total Chol / HDL Cholesterol <4.5 8.3 High     Non-HDL Cholesterol MG/DL 045203   TARGET: <(LDL-C TARGET + 30)MG/DL  Coronary Heart Disease Risk Table   TOTAL CHOLESTEROL/HDL RATIO:CHD RISK  CORONARY HEART DISEASE RISK TABLE                       MEN     WOMEN  1/2 AVERAGE RISK    3.4      3.3  AVERAGE RISK        5.8      4.4  2 X AVERAGE RISK    9.6      7.1  3 X AVERAGE RISK    23.4     11.0   Use the calculated Patient Ratio and the CHD Risk Table to determine the patient's CHD Risk.   ATP III Classification (LDL):  <100       mg/dl     Optimal  [id number]    mg/dl     Near or Above Optimal  130-159    mg/dl     Borderline  [id number]    mg/dl     High  >956>190       mg/dl     Very High  Resulting  Agency  [facility]   Specimen Collected: 09/22/21 08:10 Last Resulted: 09/22/21 13:58  [facility]  Result Received: 11/24/21 13:01    Notable Signs/Symptoms: Fatigue, lack of energy, increased thirst, frequent urination  Lifestyle & Dietary Hx He lives with his wife. He does the cooking and shopping in the home. 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   24-Hr Dietary Recall First Meal: fruit with or pb toast or egg and toast;fruit., water  Second Meal: leftovers; vegetables and complex carbs.water Snack:  Third Meal: Black eyed peas, homemade bread, water  Snack:  Beverages: water  Estimated Energy Needs Calories: 1800 Carbohydrate: 200g Protein: 135g Fat: 50g   NUTRITION DIAGNOSIS  NB-1.1 Food and nutrition-related knowledge deficit As related to Diabetes Type 2.  As evidenced by A1C 8.4%.   NUTRITION INTERVENTION  Nutrition education (E-1) on the following topics:  Nutrition and Diabetes education provided on My Plate, CHO counting, meal planning, portion sizes, timing of meals, avoiding snacks between meals unless having a low blood sugar, target ranges for A1C and blood sugars, signs/symptoms and treatment of hyper/hypoglycemia, monitoring blood sugars, taking medications as prescribed, benefits of exercising 30 minutes per day and prevention of complications of DM. Lifestyle Medicine - Whole Food, Plant Predominant Nutrition is highly recommended: Eat Plenty of vegetables, Mushrooms, fruits, Legumes, Whole Grains, Nuts, seeds in lieu of processed meats, processed snacks/pastries red meat, poultry, eggs.    -It is better to avoid simple carbohydrates including: Cakes, Sweet Desserts, Ice  Cream, Soda (diet and regular), Sweet Tea, Candies, Chips, Cookies, Store Bought Juices, Alcohol in Excess of  1-2 drinks a day, Lemonade,  Artificial Sweeteners, Doughnuts, Coffee Creamers, "Sugar-free" Products, etc, etc.  This is not a complete list...Marland Kitchen..Marland Kitchen  Exercise: If you are able: 30 -60 minutes a day ,4 days a week, or 150 minutes a week.  The longer the better.  Combine stretch, strength, and aerobic activities.  If you were told in the past that you have high risk for cardiovascular diseases, you may seek evaluation by your heart doctor prior to initiating moderate to intense exercise programs.  Handouts Provided Include  Plant based lifestyle nutrition My Plate Meal Plan Card   Learning Style & Readiness for Change Teaching method utilized: Visual & Auditory  Demonstrated degree of understanding via: Teach Back  Barriers to learning/adherence to lifestyle change: none  Goals Established by Pt Keep up the great job!!!  Maintain current food choices Keep eating 3 meals per day Focus on plant based foods Increase water to gallon. Keep up the job. Watch [employer]    MONITORING & EVALUATION Monitor blood sugars and weight.  Next Steps  Patient is to work on meal planning and exercising.Marland Kitchen

## 2022-01-17 ENCOUNTER — Encounter: Payer: Self-pay | Admitting: Nutrition

## 2022-08-14 ENCOUNTER — Ambulatory Visit: Payer: Federal, State, Local not specified - PPO | Admitting: Nutrition

## 2022-10-17 ENCOUNTER — Encounter: Payer: Federal, State, Local not specified - PPO | Admitting: Nutrition

## 2022-11-24 ENCOUNTER — Encounter: Payer: Self-pay | Admitting: Emergency Medicine

## 2022-11-24 ENCOUNTER — Other Ambulatory Visit: Payer: Self-pay

## 2022-11-24 ENCOUNTER — Ambulatory Visit
Admission: EM | Admit: 2022-11-24 | Discharge: 2022-11-24 | Disposition: A | Discharge status: Home / Self Care | Payer: Federal, State, Local not specified - PPO

## 2022-11-24 DIAGNOSIS — J0300 Acute streptococcal tonsillitis, unspecified: Secondary | ICD-10-CM | POA: Diagnosis not present

## 2022-11-24 LAB — POCT RAPID STREP A (OFFICE): Rapid Strep A Screen: POSITIVE — AB

## 2022-11-24 MED ORDER — AMOXICILLIN 875 MG PO TABS: 875.0000 mg | ORAL_TABLET | Freq: Two times a day (BID) | ORAL | 0 refills | Status: DC

## 2022-11-24 NOTE — Discharge Instructions (Addendum)
You will need to complete the full 10 days on your antibiotics to ensure full clearance of the infection.  You will likely feel better after just a few doses however this does not mean that the infection is fully cleared.  Make sure to change toothbrush after 1 to 2 days on the medication so that you do not reinfect yourself.

## 2022-11-24 NOTE — ED Provider Notes (Signed)
[facility]    CSN: [id number] Arrival date & time: 11/24/22  1615      History   Chief Complaint Chief Complaint  Patient presents with   Sore Throat    HPI George Snow is a 36 y.o. male.   Patient presenting today with 1 day history of sore throat, fever, headache, body aches, fatigue.  Denies cough, congestion, chest pain, shortness of breath, abdominal pain, nausea vomiting or diarrhea.  Taking ibuprofen with minimal relief of symptoms.  No known sick contacts recently.    Past Medical History:  Diagnosis Date   GERD (gastroesophageal reflux disease)    Obesity, Class III, BMI 40-49.9 (morbid obesity) (HCC)     Patient Active Problem List   Diagnosis Date Noted   Knee pain, acute 12/29/2013   Obesity, Class III, BMI 40-49.9 (morbid obesity) (HCC)    GERD (gastroesophageal reflux disease)     History reviewed. No pertinent surgical history.     Home Medications    Prior to Admission medications   Medication Sig Start Date End Date Taking? Authorizing Provider  amoxicillin (AMOXIL) 875 MG tablet Take 1 tablet (875 mg total) by mouth 2 (two) times daily. 11/24/22  Yes Particia Nearing, PA-C  atorvastatin (LIPITOR) 10 MG tablet Take 10 mg by mouth daily.   Yes [provider]  benzonatate (TESSALON) 100 MG capsule Take 1 capsule (100 mg total) by mouth 3 (three) times daily as needed for cough. Patient not taking: Reported on 11/24/2021 01/01/21   Durward Parcel, FNP  buPROPion (ZYBAN) 150 MG 12 hr tablet Take 150 mg by mouth every morning.    [provider]  cetirizine (ZYRTEC ALLERGY) 10 MG tablet Take 1 tablet (10 mg total) by mouth daily. Patient not taking: Reported on 11/24/2021 01/01/21   Avegno, Zachery Dakins, FNP  Flaxseed, Linseed, (FLAX SEED OIL PO) Take by mouth.    [provider]  fluticasone (FLONASE) 50 MCG/ACT nasal spray Place 1 spray into both nostrils daily for 14 days. 01/01/21 01/15/21  Avegno,  Zachery Dakins, FNP  Semaglutide, 1 MG/DOSE, (OZEMPIC, 1 MG/DOSE,) 2 MG/1.5ML SOPN Inject into the skin.    [provider]    Family History Family History  Problem Relation Age of Onset   Hypertension Mother    Luiz Blare' disease Mother    Rheum arthritis Father     Social History Social History   Tobacco Use   Smoking status: Every Day   Smokeless tobacco: Never  Substance Use Topics   Alcohol use: Not Currently   Drug use: Never     Allergies   Patient has no known allergies.   Review of Systems Review of Systems Per HPI  Physical Exam Triage Vital Signs ED Triage Vitals [11/24/22 1748]  Enc Vitals Group     BP 121/74     Pulse Rate (!) 120     Resp (!) 22     Temp (!) 100.8 F (38.2 C)     Temp Source Oral     SpO2 94 %     Weight      Height      Head Circumference      Peak Flow      Pain Score 6     Pain Loc      Pain Edu?      Excl. in GC?    No data found.  Updated Vital Signs BP 121/74 (BP Location: Right Arm)   Pulse Marland Kitchen)  120   Temp (!) 100.8 F (38.2 C) (Oral)   Resp (!) 22   SpO2 94%   Visual Acuity Right Eye Distance:   Left Eye Distance:   Bilateral Distance:    Right Eye Near:   Left Eye Near:    Bilateral Near:     Physical Exam Vitals and nursing note reviewed.  Constitutional:      Appearance: He is well-developed.  HENT:     Head: Atraumatic.     Right Ear: External ear normal.     Left Ear: External ear normal.     Nose: Nose normal.     Mouth/Throat:     Pharynx: Oropharyngeal exudate and posterior oropharyngeal erythema present.  Eyes:     Conjunctiva/sclera: Conjunctivae normal.     Pupils: Pupils are equal, round, and reactive to light.  Cardiovascular:     Rate and Rhythm: Normal rate and regular rhythm.  Pulmonary:     Effort: Pulmonary effort is normal. No respiratory distress.     Breath sounds: No wheezing or rales.  Musculoskeletal:        General: Normal range of motion.     Cervical back:  Normal range of motion and neck supple.  Lymphadenopathy:     Cervical: Cervical adenopathy present.  Skin:    General: Skin is warm and dry.  Neurological:     Mental Status: He is alert and oriented to person, place, and time.  Psychiatric:        Behavior: Behavior normal.      UC Treatments / Results  Labs (all labs ordered are listed, but only abnormal results are displayed) Labs Reviewed  POCT RAPID STREP A (OFFICE) - Abnormal; Notable for the following components:      Result Value   Rapid Strep A Screen Positive (*)    All other components within normal limits    EKG   Radiology No results found.  Procedures Procedures (including critical care time)  Medications Ordered in UC Medications - No data to display  Initial Impression / Assessment and Plan / UC Course  I have reviewed the triage vital signs and the nursing notes.  Pertinent labs & imaging results that were available during my care of the patient were reviewed by me and considered in my medical decision making (see chart for details).     Febrile and tachycardic in triage, rapid strep positive.  Treat with Amoxil, over-the-counter pain and fever reducers, supportive home care.  Return for worsening symptoms.  Final Clinical Impressions(s) / UC Diagnoses   Final diagnoses:  Strep tonsillitis     Discharge Instructions      You will need to complete the full 10 days on your antibiotics to ensure full clearance of the infection.  You will likely feel better after just a few doses however this does not mean that the infection is fully cleared.  Make sure to change toothbrush after 1 to 2 days on the medication so that you do not reinfect yourself.    ED Prescriptions     Medication Sig Dispense Auth. Provider   amoxicillin (AMOXIL) 875 MG tablet Take 1 tablet (875 mg total) by mouth 2 (two) times daily. 20 tablet Particia Nearing, [place]      PDMP not reviewed this encounter.   Particia Nearing, [place] 11/24/22 1829

## 2022-11-24 NOTE — ED Triage Notes (Addendum)
Pt reports sore throat, fever, headache, generalized body aches. Pt has been taking ibuprofen but reports fever persists.  Last dose approximately 1515.home covid test negative this am.

## 2025-01-13 ENCOUNTER — Encounter: Payer: Self-pay | Admitting: Urology

## 2025-01-13 ENCOUNTER — Emergency Department (HOSPITAL_COMMUNITY)
Admission: EM | Admit: 2025-01-13 | Discharge: 2025-01-13 | Disposition: A | Discharge status: Home / Self Care | Attending: Emergency Medicine | Admitting: Emergency Medicine

## 2025-01-13 ENCOUNTER — Telehealth: Payer: Self-pay | Admitting: Urology

## 2025-01-13 ENCOUNTER — Ambulatory Visit: Admitting: Urology

## 2025-01-13 ENCOUNTER — Emergency Department (HOSPITAL_COMMUNITY)
Admission: RE | Admit: 2025-01-13 | Discharge: 2025-01-13 | Disposition: A | Source: Ambulatory Visit | Attending: Urology | Admitting: Urology

## 2025-01-13 ENCOUNTER — Other Ambulatory Visit: Payer: Self-pay

## 2025-01-13 ENCOUNTER — Encounter (HOSPITAL_COMMUNITY): Payer: Self-pay | Admitting: Emergency Medicine

## 2025-01-13 ENCOUNTER — Emergency Department (HOSPITAL_COMMUNITY)

## 2025-01-13 VITALS — BP 132/79 | HR 103

## 2025-01-13 DIAGNOSIS — N200 Calculus of kidney: Secondary | ICD-10-CM

## 2025-01-13 DIAGNOSIS — N201 Calculus of ureter: Secondary | ICD-10-CM | POA: Diagnosis not present

## 2025-01-13 DIAGNOSIS — N12 Tubulo-interstitial nephritis, not specified as acute or chronic: Secondary | ICD-10-CM | POA: Diagnosis not present

## 2025-01-13 DIAGNOSIS — R10A2 Flank pain, left side: Secondary | ICD-10-CM | POA: Diagnosis present

## 2025-01-13 HISTORY — DX: Type 2 diabetes mellitus without complications: E11.9

## 2025-01-13 LAB — URINALYSIS, ROUTINE W REFLEX MICROSCOPIC
Bilirubin Urine: NEGATIVE
Bilirubin, UA: NEGATIVE
Glucose, UA: NEGATIVE mg/dL
Glucose, UA: NEGATIVE
Ketones, UA: NEGATIVE
Ketones, ur: NEGATIVE mg/dL
Leukocytes,Ua: NEGATIVE
Nitrite, UA: NEGATIVE
Nitrite: NEGATIVE
Protein, ur: 100 mg/dL — AB
RBC / HPF: >50 RBC/hpf (ref 0–5)
Specific Gravity, UA: 1.02 (ref 1.005–1.030)
Specific Gravity, Urine: 1.025 (ref 1.005–1.030)
Urobilinogen, Ur: 1 mg/dL (ref 0.2–1.0)
pH, UA: 5.5 (ref 5.0–7.5)
pH: 5 (ref 5.0–8.0)

## 2025-01-13 LAB — CBC WITH DIFFERENTIAL/PLATELET
Abs Immature Granulocytes: 0.05 10*3/uL (ref 0.00–0.07)
Basophils Absolute: 0 10*3/uL (ref 0.0–0.1)
Basophils Relative: 0 %
Eosinophils Absolute: 0.2 10*3/uL (ref 0.0–0.5)
Eosinophils Relative: 2 %
HCT: 44.8 % (ref 39.0–52.0)
Hemoglobin: 14.8 g/dL (ref 13.0–17.0)
Immature Granulocytes: 0 %
Lymphocytes Relative: 15 %
Lymphs Abs: 2.2 10*3/uL (ref 0.7–4.0)
MCH: 29.4 pg (ref 26.0–34.0)
MCHC: 33 g/dL (ref 30.0–36.0)
MCV: 88.9 fL (ref 80.0–100.0)
Monocytes Absolute: 0.9 10*3/uL (ref 0.1–1.0)
Monocytes Relative: 6 %
Neutro Abs: 11.6 10*3/uL — ABNORMAL HIGH (ref 1.7–7.7)
Neutrophils Relative %: 77 %
Platelets: 272 10*3/uL (ref 150–400)
RBC: 5.04 MIL/uL (ref 4.22–5.81)
RDW: 12.7 % (ref 11.5–15.5)
WBC: 15 10*3/uL — ABNORMAL HIGH (ref 4.0–10.5)
nRBC: 0 % (ref 0.0–0.2)

## 2025-01-13 LAB — BASIC METABOLIC PANEL WITH GFR
Anion gap: 17 — ABNORMAL HIGH (ref 5–15)
BUN: 11 mg/dL (ref 6–20)
CO2: 21 mmol/L — ABNORMAL LOW (ref 22–32)
Calcium: 8.9 mg/dL (ref 8.9–10.3)
Chloride: 102 mmol/L (ref 98–111)
Creatinine, Ser: 0.79 mg/dL (ref 0.61–1.24)
GFR, Estimated: >60 mL/min
Glucose, Bld: 172 mg/dL — ABNORMAL HIGH (ref 70–99)
Potassium: 4 mmol/L (ref 3.5–5.1)
Sodium: 139 mmol/L (ref 135–145)

## 2025-01-13 LAB — MICROSCOPIC EXAMINATION
Bacteria, UA: NONE SEEN
RBC, Urine: >30 /HPF — AB (ref 0–2)

## 2025-01-13 MED ORDER — TAMSULOSIN HCL 0.4 MG PO CAPS: 0.4000 mg | ORAL_CAPSULE | Freq: Every day | ORAL | 0 refills | Status: AC

## 2025-01-13 MED ORDER — SODIUM CHLORIDE 0.9 % IV SOLN
1.0000 g | Freq: Once | INTRAVENOUS | Status: AC
  Administered 2025-01-13: 1 g via INTRAVENOUS
  Filled 2025-01-13: qty 10

## 2025-01-13 MED ORDER — ONDANSETRON HCL 4 MG/2ML IJ SOLN
4.0000 mg | Freq: Once | INTRAMUSCULAR | Status: AC
  Administered 2025-01-13: 4 mg via INTRAVENOUS
  Filled 2025-01-13: qty 2

## 2025-01-13 MED ORDER — CEPHALEXIN 500 MG PO CAPS: 500.0000 mg | ORAL_CAPSULE | Freq: Two times a day (BID) | ORAL | 0 refills | Status: AC

## 2025-01-13 MED ORDER — MORPHINE SULFATE (PF) 4 MG/ML IV SOLN
4.0000 mg | Freq: Once | INTRAVENOUS | Status: AC
  Administered 2025-01-13: 4 mg via INTRAVENOUS
  Filled 2025-01-13: qty 1

## 2025-01-13 MED ORDER — NAPROXEN 500 MG PO TABS: 500.0000 mg | ORAL_TABLET | Freq: Two times a day (BID) | ORAL | 0 refills | Status: AC

## 2025-01-13 MED ORDER — HYDROCODONE-ACETAMINOPHEN 5-325 MG PO TABS: 1.0000 | ORAL_TABLET | Freq: Four times a day (QID) | ORAL | 0 refills | Status: AC | PRN

## 2025-01-13 MED ORDER — SODIUM CHLORIDE 0.9 % IV BOLUS
1000.0000 mL | Freq: Once | INTRAVENOUS | Status: AC
  Administered 2025-01-13: 1000 mL via INTRAVENOUS

## 2025-01-13 MED ORDER — KETOROLAC TROMETHAMINE 30 MG/ML IJ SOLN
30.0000 mg | Freq: Once | INTRAMUSCULAR | Status: AC
  Administered 2025-01-13: 30 mg via INTRAVENOUS
  Filled 2025-01-13: qty 1

## 2025-01-13 MED ORDER — HYDROMORPHONE HCL 1 MG/ML IJ SOLN
1.0000 mg | Freq: Once | INTRAMUSCULAR | Status: AC
  Administered 2025-01-13: 1 mg via INTRAVENOUS
  Filled 2025-01-13: qty 1

## 2025-01-13 MED ORDER — ONDANSETRON 4 MG PO TBDP: 4.0000 mg | ORAL_TABLET | Freq: Three times a day (TID) | ORAL | 0 refills | Status: AC | PRN

## 2025-01-13 NOTE — Telephone Encounter (Signed)
 Patient scheduled today

## 2025-01-13 NOTE — Progress Notes (Signed)
 "  01/13/2025 3:27 PM   George Snow 1986/03/07 [id number]  Referring provider: Trudy Elodia PARAS, PA-C [id number]  [place] [address]  No chief complaint on file.   HPI: 39 year old male without prior urologic history comes in today for follow-up of recently diagnosed left ureteral stone.  Became significantly uncomfortable, having pain last night.  Went to the emergency room with colicky pain and nausea/vomiting.  Diagnosed with a 4 x 6 mm left UPJ stone.  It took a fair amount of pain medicine to get him comfortable.  He remains fairly comfortable now with him noting a 2/10 pain level.  He has had dark urine.  He was given hydrocodone  as well as tamsulosin .    PMH: Past Medical History:  Diagnosis Date   Diabetes mellitus without complication (HCC)    GERD (gastroesophageal reflux disease)    Obesity, Class III, BMI 40-49.9 (morbid obesity) (HCC)     Surgical History: No past surgical history on file.  Home Medications:  Allergies as of 01/13/2025       Reactions   Fish Allergy         Medication List        Accurate as of January 13, 2025  3:27 PM. If you have any questions, ask your nurse or doctor.          atorvastatin 10 MG tablet Commonly known as: LIPITOR Take 10 mg by mouth daily.   buPROPion 150 MG 12 hr tablet Commonly known as: ZYBAN Take 150 mg by mouth every morning.   cephALEXin  500 MG capsule Commonly known as: KEFLEX  Take 1 capsule (500 mg total) by mouth 2 (two) times daily for 7 days.   FLAX SEED OIL PO Take by mouth.   fluticasone  50 MCG/ACT nasal spray Commonly known as: FLONASE  Place 1 spray into both nostrils daily for 14 days.   HYDROcodone -acetaminophen  5-325 MG tablet Commonly known as: NORCO/VICODIN Take 1 tablet by mouth every 6 (six) hours as needed for severe pain (pain score 7-10).   naproxen  500 MG tablet Commonly known as: NAPROSYN  Take 1 tablet (500 mg total) by mouth 2 (two) times  daily.   ondansetron  4 MG disintegrating tablet Commonly known as: ZOFRAN -ODT Take 1 tablet (4 mg total) by mouth every 8 (eight) hours as needed for nausea or vomiting.   Ozempic (1 MG/DOSE) 2 MG/1.5ML Sopn Generic drug: Semaglutide (1 MG/DOSE) Inject into the skin.   tamsulosin  0.4 MG Caps capsule Commonly known as: Flomax  Take 1 capsule (0.4 mg total) by mouth daily.        Allergies: Allergies[1]  Family History: Family History  Problem Relation Age of Onset   Hypertension Mother    Yvone' disease Mother    Rheum arthritis Father     Social History:  reports that he has been smoking. He has never used smokeless tobacco. He reports that he does not currently use alcohol. He reports that he does not use drugs.  ROS: All other review of systems were reviewed and are negative except what is noted above in HPI  Physical Exam: BP 132/79   Pulse (!) 103   Constitutional:  Alert and oriented, No acute distress. HEENT: [place] AT, moist mucus membranes.  Trachea midline, no masses. Cardiovascular: No clubbing, cyanosis, or edema. Respiratory: Normal respiratory effort, no increased work of breathing. Skin: No rashes, bruises or suspicious lesions. Neurologic: Grossly intact, no focal deficits, moving all 4 extremities. Psychiatric: Normal mood and affect.  Laboratory Data: Lab Results  Component Value Date   WBC 15.0 (H) 01/13/2025   HGB 14.8 01/13/2025   HCT 44.8 01/13/2025   MCV 88.9 01/13/2025   PLT 272 01/13/2025    Lab Results  Component Value Date   CREATININE 0.79 01/13/2025    No results found for: PSA  No results found for: TESTOSTERONE  No results found for: HGBA1C  Urinalysis   Pertinent Imaging: CT scan images reviewed with the patient and his wife, Marit.  No renal calculi.  Minimal left hydro.  I am not too sure we can see the stone on the scout reconstruction.  Assessment:  Left proximal ureteral stone, 4 x 6 mm  Plan:   1.   Continue hydrocodone  as well as tamsulosin .  I think it is fine to continue naproxen  short-term but will let him know that if he plans to have lithotripsy he will need to be off of that  2.  I will send him to [facility] to have a KUB.  I will have him set up to see us  here in about a week  Return in about 1 week (around 01/20/2025).  Garnette CHRISTELLA Shack, MD  [facility] [place]      [1]  Allergies Allergen Reactions   Fish Allergy    "

## 2025-01-13 NOTE — ED Triage Notes (Signed)
 Pt c/o left side groin pain that started around 2300.

## 2025-01-13 NOTE — Telephone Encounter (Signed)
 Seen in ER for kidney stones and needs appointment asap

## 2025-01-13 NOTE — ED Provider Notes (Signed)
 "  [place] [facility] AT [facility]  Provider Note  CSN: [id number] Arrival date & time: 01/13/25 0123  History Chief Complaint  Patient presents with   Groin Pain    George Snow is a 39 y.o. male reports sudden onset of L flank pain around 2300hrs, has moved to L groin now. Associated with N/V, urinary hesitancy. No fever. No hematuria. No history of renal stones.    Home Medications Prior to Admission medications  Medication Sig Start Date End Date Taking? Authorizing Provider  cephALEXin  (KEFLEX ) 500 MG capsule Take 1 capsule (500 mg total) by mouth 2 (two) times daily for 7 days. 01/13/25 01/20/25 Yes Roselyn Carlin NOVAK, MD  HYDROcodone -acetaminophen  (NORCO/VICODIN) 5-325 MG tablet Take 1 tablet by mouth every 6 (six) hours as needed for severe pain (pain score 7-10). 01/13/25  Yes Roselyn Carlin NOVAK, MD  naproxen  (NAPROSYN ) 500 MG tablet Take 1 tablet (500 mg total) by mouth 2 (two) times daily. 01/13/25  Yes Roselyn Carlin NOVAK, MD  ondansetron  (ZOFRAN -ODT) 4 MG disintegrating tablet Take 1 tablet (4 mg total) by mouth every 8 (eight) hours as needed for nausea or vomiting. 01/13/25  Yes Roselyn Carlin NOVAK, MD  tamsulosin  (FLOMAX ) 0.4 MG CAPS capsule Take 1 capsule (0.4 mg total) by mouth daily. 01/13/25  Yes Roselyn Carlin NOVAK, MD  atorvastatin (LIPITOR) 10 MG tablet Take 10 mg by mouth daily.    [provider]  buPROPion (ZYBAN) 150 MG 12 hr tablet Take 150 mg by mouth every morning.    [provider]  Flaxseed, Linseed, (FLAX SEED OIL PO) Take by mouth.    [provider]  fluticasone  (FLONASE ) 50 MCG/ACT nasal spray Place 1 spray into both nostrils daily for 14 days. 01/01/21 01/15/21  Avegno, Komlanvi S, FNP  Semaglutide, 1 MG/DOSE, (OZEMPIC, 1 MG/DOSE,) 2 MG/1.5ML SOPN Inject into the skin.    [provider]     Allergies    Fish allergy   Review of Systems   Review of Systems Please see HPI for pertinent positives  and negatives  Physical Exam BP 107/66   Pulse 100   Temp 98.3 F (36.8 C) (Oral)   Resp 19   Ht 6' 2 (1.88 m)   Wt (!) 147.4 kg   SpO2 94%   BMI 41.73 kg/m   Physical Exam Vitals and nursing note reviewed. Exam conducted with a chaperone present.  Constitutional:      Appearance: Normal appearance.  HENT:     Head: Normocephalic and atraumatic.     Nose: Nose normal.     Mouth/Throat:     Mouth: Mucous membranes are moist.  Eyes:     Extraocular Movements: Extraocular movements intact.     Conjunctiva/sclera: Conjunctivae normal.  Cardiovascular:     Rate and Rhythm: Normal rate.  Pulmonary:     Effort: Pulmonary effort is normal.     Breath sounds: Normal breath sounds.  Abdominal:     General: Abdomen is flat.     Palpations: Abdomen is soft.     Tenderness: There is no abdominal tenderness. There is no right CVA tenderness, left CVA tenderness or guarding.  Genitourinary:    Penis: Normal.      Testes: Normal.  Musculoskeletal:        General: No swelling. Normal range of motion.     Cervical back: Neck supple.  Skin:    General: Skin is warm and dry.  Neurological:  General: No focal deficit present.     Mental Status: He is alert.  Psychiatric:        Mood and Affect: Mood normal.     ED Results / Procedures / Treatments   EKG None  Procedures Procedures  Medications Ordered in the ED Medications  morphine  (PF) 4 MG/ML injection 4 mg (4 mg Intravenous Given 01/13/25 0139)  ondansetron  (ZOFRAN ) injection 4 mg (4 mg Intravenous Given 01/13/25 0140)  sodium chloride  0.9 % bolus 1,000 mL (0 mLs Intravenous Stopped 01/13/25 0424)  ketorolac  (TORADOL ) 30 MG/ML injection 30 mg (30 mg Intravenous Given 01/13/25 0225)  HYDROmorphone  (DILAUDID ) injection 1 mg (1 mg Intravenous Given 01/13/25 0447)  cefTRIAXone  (ROCEPHIN ) 1 g in sodium chloride  0.9 % 100 mL IVPB (0 g Intravenous Stopped 01/13/25 0517)    Initial Impression and Plan  Patient here with  symptoms most consistent with renal colic. Less likely MSK, pyelo or testicular torsion. Will check labs, send for CT. Pain/nausea meds for comfort.   ED Course   Clinical Course as of 01/13/25 0533  Tue Jan 13, 2025  0214 CBC with mild leukocytosis.  [CS]  0228 BMP is unremarkable. Patient still having some pain, will give a dose of Toradol   [CS]  0239 I personally viewed the images from radiology studies and agree with radiologist interpretation: CT shows a moderate sized proximal stone.  [CS]  0436 UA with hematuria as well as WBC and bacteria. Will treat for infected stone, culture sent. Patient also requesting additional pain medication.  [CS]  0531 Patient reports pain is well controlled now. Will plan discharge with Rx for pain, nausea meds. Abx and flomax . Recommend straining urine. Outpatient Urology follow up, RTED for any worsening symptoms or other concerns.  [CS]    Clinical Course User Index [CS] Roselyn Carlin NOVAK, MD     MDM Rules/Calculators/A&P Medical Decision Making Given presenting complaint, I considered that admission might be necessary. After review of results from ED lab and/or imaging studies, admission to the hospital is not indicated at this time.    Problems Addressed: Pyelonephritis: acute illness or injury Ureteral stone: acute illness or injury  Amount and/or Complexity of Data Reviewed Labs: ordered. Decision-making details documented in ED Course. Radiology: ordered and independent interpretation performed. Decision-making details documented in ED Course.  Risk Prescription drug management. Parenteral controlled substances. Decision regarding hospitalization.     Final Clinical Impression(s) / ED Diagnoses Final diagnoses:  Ureteral stone  Pyelonephritis    Rx / DC Orders ED Discharge Orders          Ordered    cephALEXin  (KEFLEX ) 500 MG capsule  2 times daily        01/13/25 0533    HYDROcodone -acetaminophen  (NORCO/VICODIN) 5-325 MG  tablet  Every 6 hours PRN        01/13/25 0533    tamsulosin  (FLOMAX ) 0.4 MG CAPS capsule  Daily        01/13/25 0533    ondansetron  (ZOFRAN -ODT) 4 MG disintegrating tablet  Every 8 hours PRN        01/13/25 0533    naproxen  (NAPROSYN ) 500 MG tablet  2 times daily        01/13/25 0533             Roselyn Carlin NOVAK, MD 01/13/25 [phone]  "

## 2025-01-14 ENCOUNTER — Ambulatory Visit: Admitting: Urology

## 2025-01-14 ENCOUNTER — Other Ambulatory Visit: Payer: Self-pay | Admitting: Urology

## 2025-01-14 ENCOUNTER — Telehealth: Payer: Self-pay

## 2025-01-14 DIAGNOSIS — N200 Calculus of kidney: Secondary | ICD-10-CM

## 2025-01-14 LAB — URINE CULTURE: Culture: NO GROWTH

## 2025-01-14 MED ORDER — OXYCODONE HCL 5 MG PO TABS: 5.0000 mg | ORAL_TABLET | ORAL | 0 refills | Status: AC | PRN

## 2025-01-14 NOTE — Telephone Encounter (Signed)
 FYI

## 2025-01-14 NOTE — Telephone Encounter (Signed)
-----   Message from Garnette Shack, MD sent at 01/13/2025  4:04 PM EST ----- Please call patientI have looked at his x-ray.  It is easy to see, which is good news.  It is in the same place as when he had his CT.

## 2025-01-14 NOTE — Telephone Encounter (Signed)
 Please advise.

## 2025-01-14 NOTE — Telephone Encounter (Signed)
 Called patient to give him KUB results per MD Dahlstedt patient did not answer lvm for patient to call back no DPR on file

## 2025-01-15 ENCOUNTER — Encounter (HOSPITAL_COMMUNITY): Payer: Self-pay | Admitting: Urology

## 2025-01-15 ENCOUNTER — Other Ambulatory Visit: Payer: Self-pay | Admitting: Urology

## 2025-01-15 NOTE — Progress Notes (Signed)
 LITHO PREOP PHONE CALL   ALLERGIES REVIEWED: YES  MEDICATION REVIEW DONE: YES MEDICATIONS THAT PT SHOULD HOLD (LIST): Ozempic hold 1 week last dose 1/22, Naproxen /NSAIDS hold 48hr said last dose Tuesday  CAN PT WALK UP STAIRS WITHOUT SHORTNESS OF BREATH: YES/NO HOME O2:NO CPAP: YES  IF YES, INFORMED PT TO BRING CPAP WITH TUBING AND MASK:YES   INFORMED DRIVER NEEDED FOR PROCEDURE: YES   PT WAS GIVEN BLUE FOLDER AT UROLOGY APPT: YES PT INFORMED TO BRING BLUE FOLDER WITH ALL CONTENTS: YES  REVIEWED ARRIVAL TIME AND LOCATION: YES  OTHER PERTINENT INFORMATION:

## 2025-01-16 ENCOUNTER — Ambulatory Visit (HOSPITAL_COMMUNITY)

## 2025-01-16 ENCOUNTER — Encounter (HOSPITAL_COMMUNITY): Payer: Self-pay | Admitting: Urology

## 2025-01-16 ENCOUNTER — Encounter (HOSPITAL_COMMUNITY)
Admission: RE | Disposition: A | Discharge status: Home / Self Care | Payer: Self-pay | Source: Home / Self Care | Attending: Urology

## 2025-01-16 ENCOUNTER — Other Ambulatory Visit: Payer: Self-pay

## 2025-01-16 ENCOUNTER — Ambulatory Visit (HOSPITAL_COMMUNITY)
Admission: RE | Admit: 2025-01-16 | Discharge: 2025-01-16 | Disposition: A | Discharge status: Home / Self Care | Attending: Urology | Admitting: Urology

## 2025-01-16 DIAGNOSIS — N200 Calculus of kidney: Secondary | ICD-10-CM | POA: Insufficient documentation

## 2025-01-16 DIAGNOSIS — N201 Calculus of ureter: Secondary | ICD-10-CM | POA: Diagnosis present

## 2025-01-16 LAB — GLUCOSE, CAPILLARY: Glucose-Capillary: 113 mg/dL — ABNORMAL HIGH (ref 70–99)

## 2025-01-16 MED ORDER — DIPHENHYDRAMINE HCL 25 MG PO CAPS
25.0000 mg | ORAL_CAPSULE | Freq: Once | ORAL | Status: AC
  Administered 2025-01-16: 25 mg via ORAL
  Filled 2025-01-16: qty 1

## 2025-01-16 MED ORDER — DIAZEPAM 5 MG PO TABS
10.0000 mg | ORAL_TABLET | Freq: Once | ORAL | Status: AC
  Administered 2025-01-16: 10 mg via ORAL
  Filled 2025-01-16: qty 2

## 2025-01-16 MED ORDER — CIPROFLOXACIN HCL 500 MG PO TABS
500.0000 mg | ORAL_TABLET | Freq: Once | ORAL | Status: AC
  Administered 2025-01-16: 500 mg via ORAL
  Filled 2025-01-16: qty 1

## 2025-01-16 MED ORDER — SODIUM CHLORIDE 0.9 % IV SOLN: INTRAVENOUS | Status: DC

## 2025-01-16 NOTE — Interval H&P Note (Signed)
 History and Physical Interval Note:  01/16/2025 7:26 AM  George Snow  has presented today for surgery, with the diagnosis of LEFT URETERAL STONE.  The various methods of treatment have been discussed with the patient and family. After consideration of risks, benefits and other options for treatment, the patient has consented to  Procedures: LITHOTRIPSY, ESWL (Left) as a surgical intervention.  The patient's history has been reviewed, patient examined, no change in status, stable for surgery.  I have reviewed the patient's chart and labs.  Questions were answered to the patient's satisfaction.     George Snow

## 2025-01-16 NOTE — Discharge Instructions (Signed)
 I have reviewed discharge instructions in detail with the patient. They will follow-up with me or their physician as scheduled. My nurse will also be calling the patients as per protocol.

## 2025-01-19 ENCOUNTER — Encounter (HOSPITAL_COMMUNITY): Payer: Self-pay | Admitting: Urology

## 2025-01-19 ENCOUNTER — Other Ambulatory Visit (HOSPITAL_COMMUNITY): Payer: Self-pay

## 2025-01-20 ENCOUNTER — Ambulatory Visit: Admitting: Urology

## 2025-01-20 NOTE — Telephone Encounter (Signed)
 Please advise and confirm if okay to give patient work note for both days .
